# Patient Record
Sex: Female | Born: 1990
Health system: Southern US, Community
[De-identification: ages and names within clinical notes are randomized; demographics above are authoritative.]

---

## 2012-07-22 DIAGNOSIS — J45909 Unspecified asthma, uncomplicated: Secondary | ICD-10-CM | POA: Insufficient documentation

## 2015-08-09 ENCOUNTER — Ambulatory Visit (INDEPENDENT_AMBULATORY_CARE_PROVIDER_SITE_OTHER): Payer: BC Managed Care – PPO | Admitting: Family Medicine

## 2015-08-09 ENCOUNTER — Encounter: Payer: Self-pay | Admitting: Family Medicine

## 2015-08-09 ENCOUNTER — Ambulatory Visit (INDEPENDENT_AMBULATORY_CARE_PROVIDER_SITE_OTHER): Payer: BC Managed Care – PPO

## 2015-08-09 VITALS — BP 123/78 | HR 65 | Wt 135.0 lb

## 2015-08-09 DIAGNOSIS — S93491A Sprain of other ligament of right ankle, initial encounter: Secondary | ICD-10-CM

## 2015-08-09 DIAGNOSIS — Z8781 Personal history of (healed) traumatic fracture: Secondary | ICD-10-CM | POA: Insufficient documentation

## 2015-08-09 DIAGNOSIS — M79662 Pain in left lower leg: Secondary | ICD-10-CM

## 2015-08-09 DIAGNOSIS — S93431A Sprain of tibiofibular ligament of right ankle, initial encounter: Secondary | ICD-10-CM | POA: Diagnosis not present

## 2015-08-09 DIAGNOSIS — M25571 Pain in right ankle and joints of right foot: Secondary | ICD-10-CM

## 2015-08-09 DIAGNOSIS — M2170 Unequal limb length (acquired), unspecified site: Secondary | ICD-10-CM | POA: Diagnosis not present

## 2015-08-09 DIAGNOSIS — S93439A Sprain of tibiofibular ligament of unspecified ankle, initial encounter: Secondary | ICD-10-CM | POA: Insufficient documentation

## 2015-08-09 DIAGNOSIS — M898X6 Other specified disorders of bone, lower leg: Secondary | ICD-10-CM | POA: Insufficient documentation

## 2015-08-09 NOTE — Progress Notes (Signed)
Amy Gonzalez is a 25 y.o. female who [facility] today for right ankle pain and left tibia pain. Patient is an avid runner. Running is her therapy and she likes to training for marathons.  She notes right ankle sprain about 4 weeks ago. She suffered an inversion injury. She had x-rayed urgent care which did not show fracture. She's not really had any other treatment. She notes considerable ankle pain especially at the anterior lateral malleolus area. She notes difficulty with ankle dorsiflexion and plantarflexion. She notes pain with activity.  Additionally she notes left mid tibial pain. This is been ongoing about 6 weeks now. She denies any specific injury. The pain started with training for a marathon. She notes pain at the beginning of her runs and at the end of her runs. She notes pain following runs. She continues to have pain although she does not really run for about 4 weeks. She had an x-ray at orthopedics which was normal. She denies any weakness or numbness or tingling or loss of function.   No past medical history on file. No past surgical history on file. Social History  Substance Use Topics  . Smoking status: Never Smoker   . Smokeless tobacco: Not on file  . Alcohol Use: Not on file   family history is not on file.  ROS:  No headache, visual changes, nausea, vomiting, diarrhea, constipation, dizziness, abdominal pain, skin rash, fevers, chills, night sweats, weight loss, swollen lymph nodes, body aches, joint swelling, muscle aches, chest pain, shortness of breath, mood changes, visual or auditory hallucinations.    Medications: Current Outpatient Prescriptions  Medication Sig Dispense Refill  . JUNEL FE 1/20 1-20 MG-MCG tablet   1   No current facility-administered medications for this visit.   Allergies  Allergen Reactions  . Amoxicillin Hives  . Sulfamethoxazole-Trimethoprim Hives     Exam:  BP 123/78  mmHg  Pulse 65  Wt 135 lb (61.236 kg) General: Well Developed, well nourished, and in no acute distress.  Neuro/Psych: Alert and oriented x3, extra-ocular muscles intact, able to move all 4 extremities, sensation grossly intact. Skin: Warm and dry, no rashes noted.  Respiratory: Not using accessory muscles, speaking in full sentences, trachea midline.  Cardiovascular: Pulses palpable, no extremity edema. Abdomen: Does not appear distended. MSK:  Right ankle normal-appearing no swelling. Tender to palpation syndesmosis area. Motion limited plantar and dorsiflexion. Stable ligamentous exam. Positive talar tilt test with pain. Normal strength. Pulses capillary refill and sensation intact distally. Left tibia mildly tender over the medial portion of the mid tibial. Otherwise nontender. Ankle and knee are unremarkable. Hip abduction strength 4/5 bilaterally.  Leg length left leg is 88.56 cm right is 88.0 cm.   No results found for this or any previous visit (from the past 24 hour(s)). Dg Ankle Complete Right  08/09/2015  CLINICAL DATA:  Right ankle pain, rolled ankle 4 weeks ago. EXAM: RIGHT ANKLE - COMPLETE 3+ VIEW COMPARISON:  None. FINDINGS: There is no evidence of fracture, dislocation, or joint effusion. There is no evidence of arthropathy or other focal bone abnormality. Soft tissues are unremarkable. IMPRESSION: Negative. Electronically Signed   By: Charlett NoseKevin  Dover M.D.   On: 08/09/2015 [id number]    25 year old runner with 1) syndesmosis ankle sprain. X-ray normal. Plan to refer to physical therapy. Recheck in a few weeks. Encourage extensive home exercise program.  2) left tibia pain concerning for stress fracture. Discussed options. MRI would be the  next test of choice. Differential includes exertional compartment syndrome. Patient would like to try a trial of treatment first. Plan for long leg Aircast. Recheck in about 2 weeks. Will likely resume exercises pain-free.  3) leg length  difference likely the possible cause of stress fracture. Recommend one quarter inch heel lift in the right shoe. Consider custom orthotics

## 2015-08-09 NOTE — Patient Instructions (Signed)
Thank you for coming in today. Use the aircast while active.  Attend PT.  Return in 2-4 weeks.  Do not run yet.  Bring your running shoes to the next visit.

## 2015-08-10 NOTE — Progress Notes (Signed)
Quick Note:  Ankle xray is normal. ______

## 2015-08-12 ENCOUNTER — Ambulatory Visit: Payer: BC Managed Care – PPO | Admitting: Physical Therapy

## 2015-08-12 ENCOUNTER — Ambulatory Visit: Payer: BC Managed Care – PPO | Attending: Family Medicine | Admitting: Physical Therapy

## 2015-08-12 DIAGNOSIS — R600 Localized edema: Secondary | ICD-10-CM

## 2015-08-12 DIAGNOSIS — M25671 Stiffness of right ankle, not elsewhere classified: Secondary | ICD-10-CM | POA: Insufficient documentation

## 2015-08-12 DIAGNOSIS — M6281 Muscle weakness (generalized): Secondary | ICD-10-CM | POA: Diagnosis present

## 2015-08-12 NOTE — Therapy (Signed)
[facility] [address] [place], [place], [address] Phone: [phone]   Fax:  [phone]  Physical Therapy Evaluation  Patient Details  Name: Amy Gonzalez MRN: [id number] Date of Birth: September 01, 1990 Referring Provider: Rodolph Bong MD  Encounter Date: 08/12/2015      PT End of Session - 08/12/15 1719    Visit Number 1   Number of Visits 12   Date for PT Re-Evaluation 09/23/15   PT Start Time 1630   PT Stop Time 1717   PT Time Calculation (min) 47 min   Activity Tolerance Patient tolerated treatment well   Behavior During Therapy [facility] for tasks assessed/performed      No past medical history on file.  No past surgical history on file.  There were no vitals filed for this visit.  Visit Diagnosis:  Stiffness of right ankle, not elsewhere classified - Plan: PT plan of care cert/re-cert  Localized edema - Plan: PT plan of care cert/re-cert  Muscle weakness (generalized) - Plan: PT plan of care cert/re-cert      Subjective Assessment - 08/12/15 1624    Subjective pt is a 25 y.o with CC of R ankle pain 4 1/2 weeks ago. She rpeorted running 12 1/2 miles in and was trying to avoid getting hit by traffic and as she hit her heel on the side walk her foot hit the grass and rolled her ankle. since onset she reports it has gotten better becuase she is able to walk on it without an assistive device. pt currentl y wears an ASO brace for support, She has tried running but has pain after.    Limitations Standing;Walking;House hold activities   How long can you sit comfortably? 20-30 min   How long can you stand comfortably? unlimited   How long can you walk comfortably? unlimited   Diagnostic tests x-ray 08/09/2015    Patient Stated Goals want to be able to run without pain ASAP.   Currently in Pain? Yes   Pain Score 3    Pain Location Ankle   Pain Orientation Right   Pain Descriptors / Indicators Throbbing  tender   Pain Type  Chronic pain   Pain Onset More than a month ago   Pain Frequency Intermittent   Aggravating Factors  After running, pulling toes up, side to side motion, stairs   Pain Relieving Factors stop and rest, ice with elevation,             [facility] PT Assessment - 08/12/15 1617    Assessment   Medical Diagnosis R high ankle sprain   Referring Provider Rodolph Bong MD   Onset Date/Surgical Date --  4 weeks ago   Hand Dominance Right   Next MD Visit 08/30/2015   Prior Therapy no   Precautions   Precautions None   Restrictions   Weight Bearing Restrictions No   Balance Screen   Has the patient fallen in the past 6 months Yes   How many times? 1   Has the patient had a decrease in activity level because of a fear of falling?  No   Is the patient reluctant to leave their home because of a fear of falling?  No   Home Environment   Living Environment Private residence   Living Arrangements Spouse/significant other   Available Help at Discharge Available PRN/intermittently;Available 24 hours/day   Type of Home House   Home Access Level entry   Home Layout Two level  Alternate Level Stairs-Number of Steps 12   Alternate Level Stairs-Rails Right   Home Equipment --  ASO ankle brace,    Prior Function   Level of Independence Independent;Independent with basic ADLs   Vocation Full time employment  6th grade   Vocation Requirements prolonged standing, walking    Leisure running, swimming, biking, walking the dog   Cognition   Overall Cognitive Status Within Functional Limits for tasks assessed   Observation/Other Assessments   Lower Extremity Functional Scale  46/80   Circumferential Edema   Circumferential - Left  47.5cm   Figure 8 Edema   Figure 8 - Right  48 cm   Posture/Postural Control   Posture/Postural Control Postural limitations   Postural Limitations Rounded Shoulders;Forward head   ROM / Strength   AROM / PROM / Strength AROM;PROM;Strength   AROM   AROM Assessment Site  Ankle   Right/Left Ankle Right;Left   Right Ankle Dorsiflexion -10  ERP   Right Ankle Plantar Flexion 30  ERP   Right Ankle Inversion 6   Right Ankle Eversion 1   Left Ankle Dorsiflexion 10   Left Ankle Plantar Flexion 46   Left Ankle Inversion 22   Left Ankle Eversion 12   PROM   PROM Assessment Site Ankle   Right/Left Ankle Right   Right Ankle Dorsiflexion 12  boggy feeling during PROM   Right Ankle Plantar Flexion 42   Right Ankle Inversion 22   Right Ankle Eversion 12   Strength   Strength Assessment Site Ankle   Right/Left Ankle Right;Left   Right Ankle Dorsiflexion 4/5   Right Ankle Plantar Flexion 4/5   Right Ankle Inversion 3+/5   Right Ankle Eversion 3+/5   Left Ankle Dorsiflexion 5/5   Left Ankle Plantar Flexion 5/5   Left Ankle Inversion 5/5   Left Ankle Eversion 5/5   Palpation   Palpation comment tenderness located at the ATFL, CFL PTFL as well as the peroneal brevis and tertisu muscles.    Special Tests    Special Tests Ankle/Foot Special Tests   Ankle/Foot Special Tests  Anterior Drawer Test;Talar Tilt Test;Kleiger Test   Anterior Drawer Test   Findings Positive   Side  Right   Talar Tilt Test    Findings Postive   Side  Right   Kleiger Test   Findings Positive   Side Right                           PT Education - 08/12/15 1719    Education provided Yes   Education Details evaluation findings, POC, goals, HEP   Person(s) Educated Patient   Methods Explanation   Comprehension Verbalized understanding          PT Short Term Goals - 08/12/15 1734    PT SHORT TERM GOAL #1   Title pt will be I with initial HEP (09/02/2015)   Time 3   Period Weeks   Status New   PT SHORT TERM GOAL #2   Title pt be able to verbalize and demonstrate techniques to reduce R ankle pain and edema via RICE and HEP (09/02/2015)   Time 3   Period Weeks   Status New           PT Long Term Goals - 08/12/15 1735    PT LONG TERM GOAL #1    Title pt will be I with all HEP as of last visit (09/23/2015)  Time 6   Period Weeks   Status New   PT LONG TERM GOAL #2   Title pt will improve her R ankle DF and Eversion to >/= 6 with degrees with </=2/10 pain to assist with functional and efficient gait pattern (09/23/2015)   Time 6   Period Weeks   Status New   PT LONG TERM GOAL #3   Title pt will improve R ankle strength to >/= 4/5 with </= 1/10 pain to assist with endurance and running activities to assist with pts personal goal of getting back to running (09/23/2015)   Time 6   Period Weeks   Status New   PT LONG TERM GOAL #4   Title pt will be able to maintain R SLS for >/= 20 sec with mild postural sway to assist with increased proprioception to prevent ankle injuries (09/23/2015)   Time 6   Period Weeks   Status New   PT LONG TERM GOAL #5   Title pt will improve her LEFS score by >/= 10 points to demonstrate improvement in function at discharge (09/23/2015)   Time 6   Period Weeks   Status New               Plan - 08/12/15 1720    Clinical Impression Statement Anuoluwapo presents to [facility] as a low complexity evaluation with CC of R ankle pain following rolling her ankle about 4 weeks ago while running. AROM is limited in all planes with the most significant motions being Dorsiflexion and everision, PROM of the R ankle is [facility] with soreness at end ranges.  She demonstrates limited strength seondary to soreness and limited AROM. She currenlty wears an ASO brace for support. Palpation revealed tendernerness in all lateral ligaments of the anlke as well as the superior anterior tibiofibular ligaments as well as peroneal muscle soreness. She would benefit from physical therapy to improve ankle AROM, strength and stability and maximize her function by addressing the impairments listed.    Pt will benefit from skilled therapeutic intervention in order to improve on the following deficits Pain;Improper body mechanics;Postural  dysfunction;Decreased strength;Increased edema;Impaired flexibility;Decreased range of motion;Decreased endurance;Decreased activity tolerance;Decreased balance;Hypomobility   Rehab Potential Good   PT Frequency 2x / week   PT Duration 6 weeks   PT Treatment/Interventions ADLs/Self Care Home Management;Electrical Stimulation;Cryotherapy;Iontophoresis /ml Dexamethasone;Moist Heat;Therapeutic exercise;Therapeutic activities;Dry needling;Passive range of motion;Patient/family education;Ultrasound;Balance training;Vasopneumatic Device;Taping;Manual techniques;Gait training   PT Next Visit Plan assess/ review HEP, Ankle mobility / mobs, manual for soreness in lateral ankle., strengthening, balance training, modalities PRN   PT Home Exercise Plan towel scrunches, ankle ABC's, 4-way ankle, calf stretching   Consulted and Agree with Plan of Care Patient         Problem List Patient Active Problem List   Diagnosis Date Noted  . Pain, joint, ankle, right 08/09/2015  . Personal history of healed traumatic fracture 08/09/2015  . Pain in left tibia 08/09/2015  . High ankle sprain 08/09/2015  . 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 [place], [place], [address] Phone: [phone]   Fax:  [phone]  Name: Amy Gonzalez MRN: [id number] Date of Birth: 25-Jan-1991

## 2015-08-30 ENCOUNTER — Ambulatory Visit: Payer: BC Managed Care – PPO | Admitting: Family Medicine

## 2015-08-31 ENCOUNTER — Ambulatory Visit: Payer: BC Managed Care – PPO | Attending: Family Medicine | Admitting: Physical Therapy

## 2015-08-31 DIAGNOSIS — R600 Localized edema: Secondary | ICD-10-CM | POA: Diagnosis present

## 2015-08-31 DIAGNOSIS — M6281 Muscle weakness (generalized): Secondary | ICD-10-CM

## 2015-08-31 DIAGNOSIS — R2241 Localized swelling, mass and lump, right lower limb: Secondary | ICD-10-CM | POA: Diagnosis present

## 2015-08-31 DIAGNOSIS — M25671 Stiffness of right ankle, not elsewhere classified: Secondary | ICD-10-CM

## 2015-09-01 NOTE — Therapy (Signed)
[facility] [address] [place], [place], [address] Phone: [phone]   Fax:  [phone]  Physical Therapy Treatment  Patient Details  Name: Amy Gonzalez MRN: [id number] Date of Birth: September 03, 1990 Referring Provider: Rodolph Bong MD  Encounter Date: 08/31/2015      PT End of Session - 08/31/15 1748    Visit Number 2   Number of Visits 12   Date for PT Re-Evaluation 09/23/15   PT Start Time 1630   PT Stop Time 1650   PT Time Calculation (min) 20 min   Activity Tolerance Patient tolerated treatment well   Behavior During Therapy [facility] for tasks assessed/performed      No past medical history on file.  No past surgical history on file.  There were no vitals filed for this visit.      Subjective Assessment - 08/31/15 1736    Subjective Pt reports she has been at the beach and has not had much time to do her exercisres. She tried running and had pain. Her pain today is about a 2/10.    Limitations Standing;Walking;House hold activities   Pain Score 2    Pain Location --  R lateral ankle    Pain Orientation Right   Pain Descriptors / Indicators Throbbing   Pain Type Chronic pain   Pain Onset More than a month ago   Pain Frequency Intermittent                         [facility] Adult PT Treatment/Exercise - 08/31/15 0001    Ankle Exercises: Aerobic   Stationary Bike 5 min    Ankle Exercises: Standing   Other Standing Ankle Exercises cone drill 1' 2x10; rebounder 3way x20; lateral band wals 10 steps x2; dot drill 2x5; eccentric step down 4 inch step 2x10; step on to air-ex forward/ lateral 2x10;                 PT Education - 09/01/15 0748    Education provided Yes   Education Details updated HEP and educated the patient on the improtance of performing her HEP   Person(s) Educated Patient   Methods Explanation;Demonstration;Verbal cues   Comprehension Verbalized understanding          PT  Short Term Goals - 08/12/15 1734    PT SHORT TERM GOAL #1   Title pt will be I with initial HEP (09/02/2015)   Time 3   Period Weeks   Status New   PT SHORT TERM GOAL #2   Title pt be able to verbalize and demonstrate techniques to reduce R ankle pain and edema via RICE and HEP (09/02/2015)   Time 3   Period Weeks   Status New           PT Long Term Goals - 08/12/15 1735    PT LONG TERM GOAL #1   Title pt will be I with all HEP as of last visit (09/23/2015)   Time 6   Period Weeks   Status New   PT LONG TERM GOAL #2   Title pt will improve her R ankle DF and Eversion to >/= 6 with degrees with </=2/10 pain to assist with functional and efficient gait pattern (09/23/2015)   Time 6   Period Weeks   Status New   PT LONG TERM GOAL #3   Title pt will improve R ankle strength to >/= 4/5 with </= 1/10 pain to  assist with endurance and running activities to assist with pts personal goal of getting back to running (09/23/2015)   Time 6   Period Weeks   Status New   PT LONG TERM GOAL #4   Title pt will be able to maintain R SLS for >/= 20 sec with mild postural sway to assist with increased proprioception to prevent ankle injuries (09/23/2015)   Time 6   Period Weeks   Status New   PT LONG TERM GOAL #5   Title pt will improve her LEFS score by >/= 10 points to demonstrate improvement in function at discharge (09/23/2015)   Time 6   Period Weeks   Status New       There-ex: to improve ankle strength  Neuro re-ed: single leg stance activity to improve ankle control  Manual therapy: AP glides; manual gastroc stretch to improve Lleftkle ROM       Patient will benefit from skilled therapeutic intervention in order to improve the following deficits and impairments:     Visit Diagnosis: Stiffness of right ankle, not elsewhere classified - Plan: PT plan of care cert/re-cert  Muscle weakness (generalized) - Plan: PT plan of care cert/re-cert  Localized swelling, mass and lump,  right lower limb - Plan: PT plan of care cert/re-cert     Problem List Patient Active Problem List   Diagnosis Date Noted  . Pain, joint, ankle, right 08/09/2015  . Personal history of healed traumatic fracture 08/09/2015  . Pain in left tibia 08/09/2015  . High ankle sprain 08/09/2015  . 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

## 2015-09-02 ENCOUNTER — Ambulatory Visit: Payer: BC Managed Care – PPO | Admitting: Physical Therapy

## 2015-09-02 DIAGNOSIS — R2241 Localized swelling, mass and lump, right lower limb: Secondary | ICD-10-CM

## 2015-09-02 DIAGNOSIS — M25671 Stiffness of right ankle, not elsewhere classified: Secondary | ICD-10-CM | POA: Diagnosis not present

## 2015-09-02 DIAGNOSIS — M6281 Muscle weakness (generalized): Secondary | ICD-10-CM

## 2015-09-02 NOTE — Therapy (Signed)
[facility] [address] [place], [place], [address] Phone: [phone]   Fax:  [phone]  Physical Therapy Treatment  Patient Details  Name: Amy Gonzalez MRN: [id number] Date of Birth: 1991/01/31 Referring Provider: Rodolph BongEvan S Corey MD  Encounter Date: 09/02/2015      PT End of Session - 09/02/15 1706    Visit Number 3   Number of Visits 12   Date for PT Re-Evaluation 09/23/15   Activity Tolerance Patient tolerated treatment well   Behavior During Therapy [facility] for tasks assessed/performed      No past medical history on file.  No past surgical history on file.  There were no vitals filed for this visit.                                 PT Short Term Goals - 09/02/15 1712    PT SHORT TERM GOAL #1   Title pt will be I with initial HEP (09/02/2015)   Baseline Pt performing HEP at home   Status Achieved   PT SHORT TERM GOAL #2   Title Pt performing rice when needed    Time 3   Period Weeks   Status New           PT Long Term Goals - 09/02/15 1713    PT LONG TERM GOAL #1   Title pt will be I with all HEP as of last visit (09/23/2015)   Time 6   Period Weeks   Status On-going   PT LONG TERM GOAL #2   Title pt will improve her R ankle DF and Eversion to >/= 6 with degrees with </=2/10 pain to assist with functional and efficient gait pattern (09/23/2015)   Baseline DF 20 degrees today without pain.    Time 6   Period Weeks   Status Achieved   PT LONG TERM GOAL #3   Title pt will improve R ankle strength to >/= 4/5 with </= 1/10 pain to assist with endurance and running activities to assist with pts personal goal of getting back to running (09/23/2015)   Time 6   Period Weeks   Status On-going   PT LONG TERM GOAL #4   Title pt will be able to maintain R SLS for >/= 20 sec with mild postural sway to assist with increased proprioception to prevent ankle injuries (09/23/2015)   Baseline  performing high level dynamic single leg activity    Time 6   Period Weeks   Status On-going   PT LONG TERM GOAL #5   Title pt will improve her LEFS score by >/= 10 points to demonstrate improvement in function at discharge (09/23/2015)   Time 6   Period Weeks   Status New               Plan - 09/02/15 1707    Clinical Impression Statement Pt tolerated treatment well. She had no significant increase in pain with single leg stance activity. PT added in cybex leg press and resisted cable walk forward and back. She will attmept running with her running group at [employer] today.    PT Frequency 2x / week   PT Duration 6 weeks   PT Treatment/Interventions ADLs/Self Care Home Management;Electrical Stimulation;Cryotherapy;Iontophoresis 4mg /ml Dexamethasone;Moist Heat;Therapeutic exercise;Therapeutic activities;Dry needling;Passive range of motion;Patient/family education;Ultrasound;Balance training;Vasopneumatic Device;Taping;Manual techniques;Gait training   PT Next Visit Plan assess/ review HEP, Ankle mobility / mobs, manual  for soreness in lateral ankle., strengthening, balance training, modalities PRN   PT Home Exercise Plan towel scrunches, ankle ABC's, 4-way ankle, calf stretching, cone drill, lateral band walks, eccentric step downs, eccentric calf raise, dot drill.    Consulted and Agree with Plan of Care Patient     There-ex: to improve eccentric control and strength  Neuro re-ed: to improve single leg stance  Manual therapy: to improve df ROM   Patient will benefit from skilled therapeutic intervention in order to improve the following deficits and impairments:  Pain, Improper body mechanics, Postural dysfunction, Decreased strength, Increased edema, Impaired flexibility, Decreased range of motion, Decreased endurance, Decreased activity tolerance, Decreased balance, Hypomobility  Visit Diagnosis: Stiffness of right ankle, not elsewhere classified  Muscle weakness  (generalized)  Localized swelling, mass and lump, right lower limb     Problem List Patient Active Problem List   Diagnosis Date Noted  . Pain, joint, ankle, right 08/09/2015  . Personal history of healed traumatic fracture 08/09/2015  . Pain in left tibia 08/09/2015  . High ankle sprain 08/09/2015  . 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

## 2015-09-06 ENCOUNTER — Ambulatory Visit: Payer: BC Managed Care – PPO | Admitting: Physical Therapy

## 2015-09-06 DIAGNOSIS — M25671 Stiffness of right ankle, not elsewhere classified: Secondary | ICD-10-CM | POA: Diagnosis not present

## 2015-09-06 DIAGNOSIS — R2241 Localized swelling, mass and lump, right lower limb: Secondary | ICD-10-CM

## 2015-09-06 DIAGNOSIS — R600 Localized edema: Secondary | ICD-10-CM

## 2015-09-06 DIAGNOSIS — M6281 Muscle weakness (generalized): Secondary | ICD-10-CM

## 2015-09-06 NOTE — Therapy (Signed)
[facility] [address] [place], [place], [address] Phone: [phone]   Fax:  [phone]  Physical Therapy Treatment  Patient Details  Name: Amy PesaJulia Gonzalez MRN: [id number] Date of Birth: 1990-10-25 Referring Provider: Rodolph BongEvan S Corey MD  Encounter Date: 09/06/2015      PT End of Session - 09/06/15 1717    Visit Number 45   Number of Visits 12   Date for PT Re-Evaluation 09/23/15   PT Start Time 1630   PT Stop Time 1715   PT Time Calculation (min) 45 min      No past medical history on file.  No past surgical history on file.  There were no vitals filed for this visit.      Subjective Assessment - 09/06/15 1639    Subjective Pt ran 3 miles without pain. She feels like it is improving. The only thing she is having some pain with is eccentric heel raises.    Limitations Standing;Walking;House hold activities   How long can you sit comfortably? 20-30 min   How long can you stand comfortably? unlimited   How long can you walk comfortably? unlimited   Diagnostic tests x-ray 08/09/2015    Patient Stated Goals want to be able to run without pain ASAP.   Currently in Pain? No/denies                         [facility] Treatment/Exercise - 09/06/15 0001    Ankle Exercises: Aerobic   Elliptical 5 min    Ankle Exercises: Standing   Other Standing Ankle Exercises cone drill 1' 2x10; rebounder 3way x20; lateral band wals 10 steps x2; dot drill 2x5; eccentric step down 4 inch step 2x10; step on to air-ex forward/ lateral 2x10; Bosu single leg hops 2x10 fwd and lateral; bosu squats 2x10;                 PT Education - 09/06/15 1642    Education Details continue slow progression of running activity.    Person(s) Educated Patient   Methods Explanation   Comprehension Verbalized understanding          PT Short Term Goals - 09/06/15 1719    PT SHORT TERM GOAL #1   Title pt will be I with initial HEP  (09/02/2015)   Baseline Pt performing HEP at home   Time 3   Period Weeks   PT SHORT TERM GOAL #2   Title Pt performing rice when needed    Baseline Pt perfroming as needed.    Status On-going           PT Long Term Goals - 09/06/15 1720    PT LONG TERM GOAL #1   Title pt will be I with all HEP as of last visit (09/23/2015)   Time 6   Period Weeks   Status On-going   PT LONG TERM GOAL #2   Title pt will improve her R ankle DF and Eversion to >/= 6 with degrees with </=2/10 pain to assist with functional and efficient gait pattern (09/23/2015)   PT LONG TERM GOAL #3   Title pt will improve R ankle strength to >/= 4/5 with </= 1/10 pain to assist with endurance and running activities to assist with pts personal goal of getting back to running (09/23/2015)   Time 6   Period Weeks   PT LONG TERM GOAL #4   Title pt will be able  to maintain R SLS for >/= 20 sec with mild postural sway to assist with increased proprioception to prevent ankle injuries (09/23/2015)   Baseline performing high level dynamic single leg activity    Time 6   Period Weeks   Status Achieved   PT LONG TERM GOAL #5   Title pt will improve her LEFS score by >/= 10 points to demonstrate improvement in function at discharge (09/23/2015)   Baseline to be assessed   Time 6   Period Weeks   Status New               Plan - 09/06/15 1718    Clinical Impression Statement Pt continues to make great progress. She had no pain today and had no pain running. She is approaching all long term goals. She is concerned about some lingering pain. Therapy assured her if she keeps up with her single leg stance activity her pain should decrease.   Rehab Potential Good   PT Frequency 2x / week   PT Duration 6 weeks   PT Treatment/Interventions ADLs/Self Care Home Management;Electrical Stimulation;Cryotherapy;Iontophoresis /ml Dexamethasone;Moist Heat;Therapeutic exercise;Therapeutic activities;Dry needling;Passive range of  motion;Patient/family education;Ultrasound;Balance training;Vasopneumatic Device;Taping;Manual techniques;Gait training   PT Next Visit Plan assess/ review HEP, Ankle mobility / mobs, manual for soreness in lateral ankle., strengthening, balance training, modalities PRN   PT Home Exercise Plan towel scrunches, ankle ABC's, 4-way ankle, calf stretching, cone drill, lateral band walks, eccentric step downs, eccentric calf raise, dot drill.    Consulted and Agree with Plan of Care Patient      Patient will benefit from skilled therapeutic intervention in order to improve the following deficits and impairments:  Pain, Improper body mechanics, Postural dysfunction, Decreased strength, Increased edema, Impaired flexibility, Decreased range of motion, Decreased endurance, Decreased activity tolerance, Decreased balance, Hypomobility  Visit Diagnosis: Muscle weakness (generalized)  Localized edema  Stiffness of right ankle, not elsewhere classified  Localized swelling, mass and lump, right lower limb  There-ex: to improve ankle strength and stability  Neuro-muscualr control: Pt educated on the importance of single leg stability. Pt performed activity to increase neuro muscular control with running.    Problem List Patient Active Problem List   Diagnosis Date Noted  . Pain, joint, ankle, right 08/09/2015  . Personal history of healed traumatic fracture 08/09/2015  . Pain in left tibia 08/09/2015  . High ankle sprain 08/09/2015  . 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

## 2015-09-08 ENCOUNTER — Ambulatory Visit: Payer: BC Managed Care – PPO | Admitting: Physical Therapy

## 2015-09-08 DIAGNOSIS — M6281 Muscle weakness (generalized): Secondary | ICD-10-CM

## 2015-09-08 DIAGNOSIS — R600 Localized edema: Secondary | ICD-10-CM

## 2015-09-08 DIAGNOSIS — M25671 Stiffness of right ankle, not elsewhere classified: Secondary | ICD-10-CM

## 2015-09-08 NOTE — Therapy (Addendum)
Amy Gonzalez, [place], [address] Phone: [phone]   Fax:  [phone]  Physical Therapy Treatment  Patient Details  Name: Amy Gonzalez MRN: [id number] Date of Birth: 02/24/1991 Referring Provider: Gregor Hams MD  Encounter Date: 09/08/2015      PT End of Session - 09/08/15 2016    Visit Number 6   Number of Visits 12   Date for PT Re-Evaluation 09/23/15   PT Start Time 1630   PT Stop Time 1710   PT Time Calculation (min) 40 min   Activity Tolerance Patient limited by pain   Behavior During Therapy [facility] for tasks assessed/performed      No past medical history on file.  No past surgical history on file.  There were no vitals filed for this visit.      Subjective Assessment - 09/08/15 2014    Subjective Pt ran 2 more miles on Wednesday. She was sore from the running and the treatment. She is doing her exercises. She had some snterior ankle pain this morning.    Limitations Standing;Walking;House hold activities   How long can you sit comfortably? 20-30 min   How long can you stand comfortably? unlimited   How long can you walk comfortably? unlimited   Diagnostic tests x-ray 08/09/2015    Patient Stated Goals want to be able to run without pain ASAP.   Pain Score 2                          [facility] Adult PT Treatment/Exercise - 09/08/15 0001    Ankle Exercises: Aerobic   Elliptical 5 min    Ankle Exercises: Standing   Other Standing Ankle Exercises cone drill 1' 2x10; rebounder 3way x20; lateral band wals 10 steps x2; dot drill 2x5; eccentric step down 4 inch step 2x10; step on to air-ex forward/ lateral 2x10; Bosu single leg hops 2x10 fwd and lateral; bosu squats 2x10;    Other Standing Ankle Exercises lunges 2x10                 PT Education - 09/08/15 2015    Education provided Yes   Person(s) Educated Patient   Methods Explanation   Comprehension Verbalized  understanding          PT Short Term Goals - 09/06/15 1719    PT SHORT TERM GOAL #1   Title pt will be I with initial HEP (09/02/2015)   Baseline Pt performing HEP at home   Time 3   Period Weeks   PT SHORT TERM GOAL #2   Title Pt performing rice when needed    Baseline Pt perfroming as needed.    Status On-going           PT Long Term Goals - 09/06/15 1720    PT LONG TERM GOAL #1   Title pt will be I with all HEP as of last visit (09/23/2015)   Time 6   Period Weeks   Status On-going   PT LONG TERM GOAL #2   Title pt will improve her R ankle DF and Eversion to >/= 6 with degrees with </=2/10 pain to assist with functional and efficient gait pattern (09/23/2015)   PT LONG TERM GOAL #3   Title pt will improve R ankle strength to >/= 4/5 with </= 1/10 pain to assist with endurance and running activities to assist with pts personal goal of getting back to  running (09/23/2015)   Time 6   Period Weeks   PT LONG TERM GOAL #4   Title pt will be able to maintain R SLS for >/= 20 sec with mild postural sway to assist with increased proprioception to prevent ankle injuries (09/23/2015)   Baseline performing high level dynamic single leg activity    Time 6   Period Weeks   Status Achieved   PT LONG TERM GOAL #5   Title pt will improve her LEFS score by >/= 10 points to demonstrate improvement in function at discharge (09/23/2015)   Baseline to be assessed   Time 6   Period Weeks   Status New               Plan - 09/08/15 2020    Clinical Impression Statement Pt is making good progress depsite some pain after the last visit. She is having only minor pain. She will continue to up her milage and continue with exercises. She wil return to physical therapy PRN. If she feels like she is not maintaining her progress she may follow up. If not D/C in 3-4 weeks.    Rehab Potential Good   PT Frequency 2x / week   PT Duration 6 weeks   PT Treatment/Interventions ADLs/Self Care Home  Management;Electrical Stimulation;Cryotherapy;Iontophoresis 36m/ml Dexamethasone;Moist Heat;Therapeutic exercise;Therapeutic activities;Dry needling;Passive range of motion;Patient/family education;Ultrasound;Balance training;Vasopneumatic Device;Taping;Manual techniques;Gait training   PT Next Visit Plan assess/ review HEP, Ankle mobility / mobs, manual for soreness in lateral ankle., strengthening, balance training, modalities PRN   PT Home Exercise Plan towel scrunches, ankle ABC's, 4-way ankle, calf stretching, cone drill, lateral band walks, eccentric step downs, eccentric calf raise, dot drill.    Consulted and Agree with Plan of Care Patient      Patient will benefit from skilled therapeutic intervention in order to improve the following deficits and impairments:  Pain, Improper body mechanics, Postural dysfunction, Decreased strength, Increased edema, Impaired flexibility, Decreased range of motion, Decreased endurance, Decreased activity tolerance, Decreased balance, Hypomobility  Visit Diagnosis: Localized edema  Muscle weakness (generalized)  Stiffness of right ankle, not elsewhere classified  PHYSICAL THERAPY DISCHARGE SUMMARY  Visits from Start of Care: 6  Current functional level related to goals / functional outcomes: Was having minimal symptoms. Did not follow up. D/C to HEP    Remaining deficits: Minimal pain at time of D/C   Education / Equipment: HEP  Plan: Patient agrees to discharge.  Patient goals were partially met. Patient is being discharged due to not returning since the last visit.  ?????       Problem List Patient Active Problem List   Diagnosis Date Noted  . Pain, joint, ankle, right 08/09/2015  . Personal history of healed traumatic fracture 08/09/2015  . Pain in left tibia 08/09/2015  . High ankle sprain 08/09/2015  . 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

## 2015-09-13 ENCOUNTER — Encounter: Payer: BC Managed Care – PPO | Admitting: Physical Therapy

## 2015-09-15 ENCOUNTER — Encounter: Payer: BC Managed Care – PPO | Admitting: Physical Therapy

## 2015-09-20 ENCOUNTER — Encounter: Payer: BC Managed Care – PPO | Admitting: Physical Therapy

## 2015-09-22 ENCOUNTER — Encounter: Payer: BC Managed Care – PPO | Admitting: Physical Therapy

## 2016-12-28 ENCOUNTER — Other Ambulatory Visit (HOSPITAL_COMMUNITY)
Admission: RE | Admit: 2016-12-28 | Discharge: 2016-12-28 | Disposition: A | Discharge status: Home / Self Care | Payer: BC Managed Care – PPO | Source: Ambulatory Visit | Attending: Family Medicine | Admitting: Family Medicine

## 2016-12-28 ENCOUNTER — Other Ambulatory Visit: Payer: Self-pay | Admitting: Family Medicine

## 2016-12-28 DIAGNOSIS — Z01411 Encounter for gynecological examination (general) (routine) with abnormal findings: Secondary | ICD-10-CM | POA: Insufficient documentation

## 2016-12-29 LAB — CYTOLOGY - PAP: Diagnosis: NEGATIVE

## 2017-11-22 IMAGING — CR DG ANKLE COMPLETE 3+V*R*
3 series · 3 of 3 positions shown · non-contrast
Comparison: None.

CLINICAL DATA: Right ankle pain, rolled ankle 4 weeks ago.

EXAM:
RIGHT ANKLE - COMPLETE 3+ VIEW

[ankle ap]
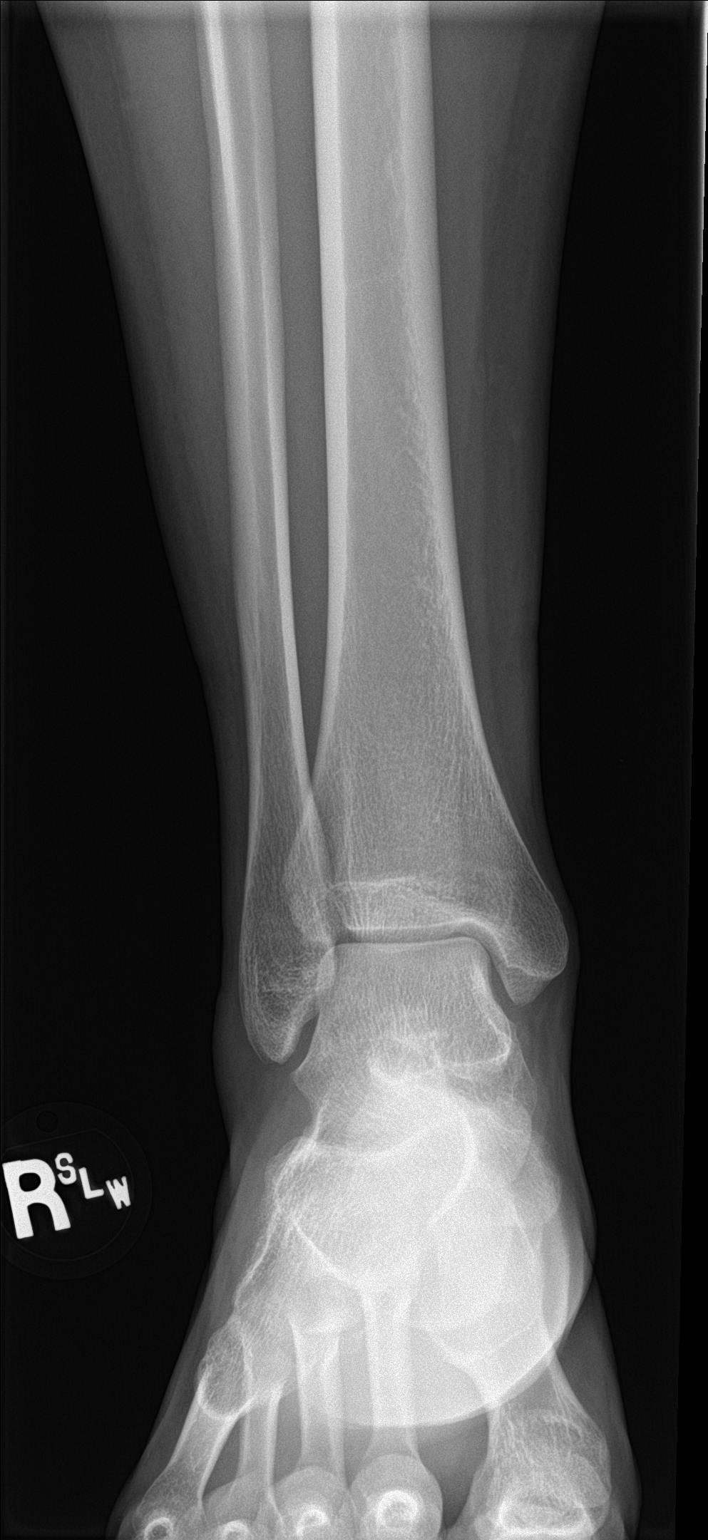

[ankle obl]
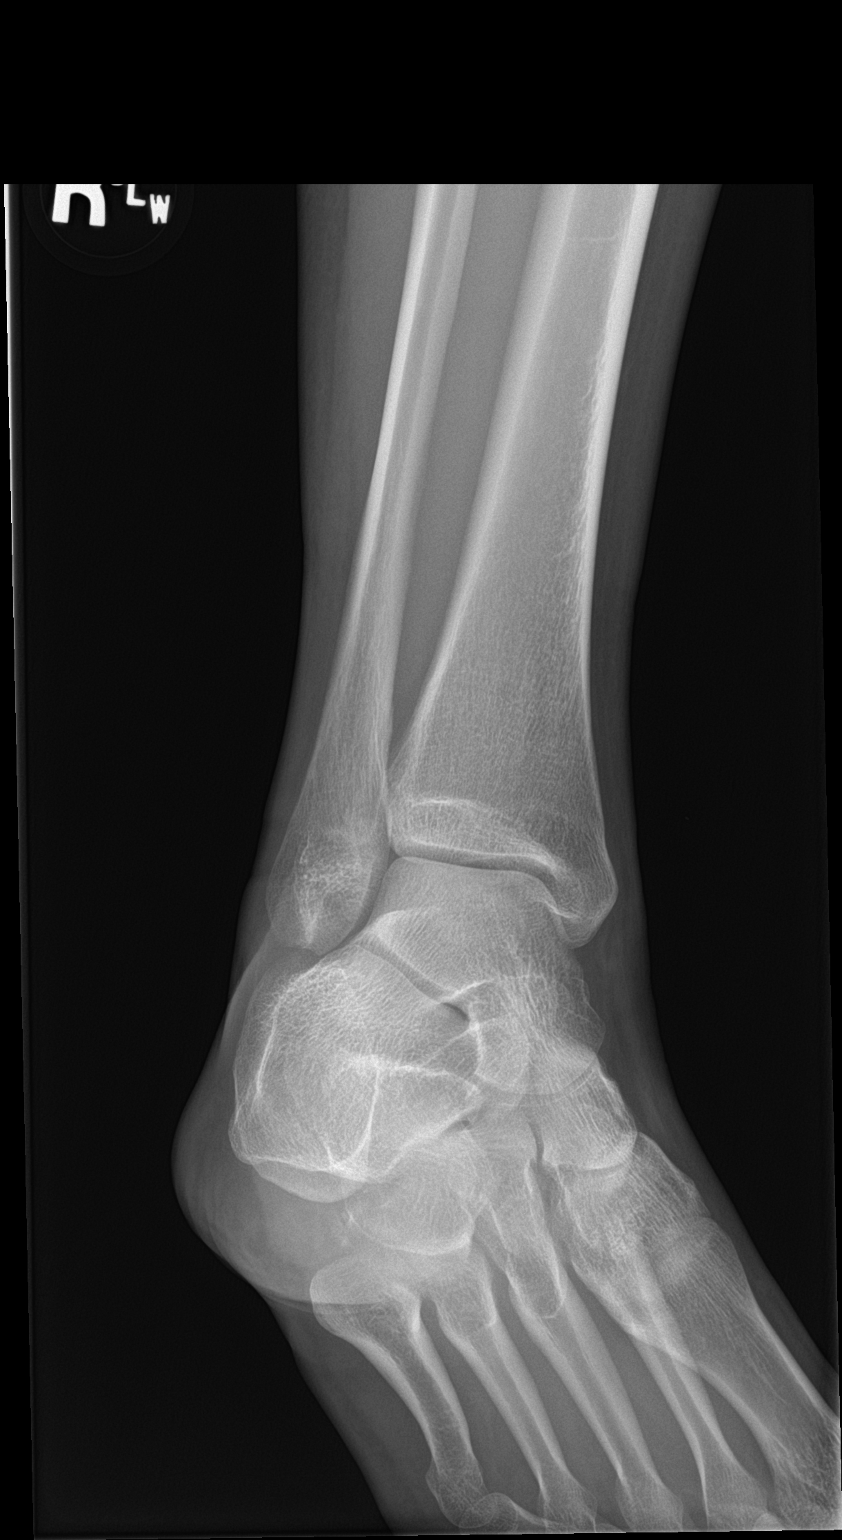

[ankle lat]
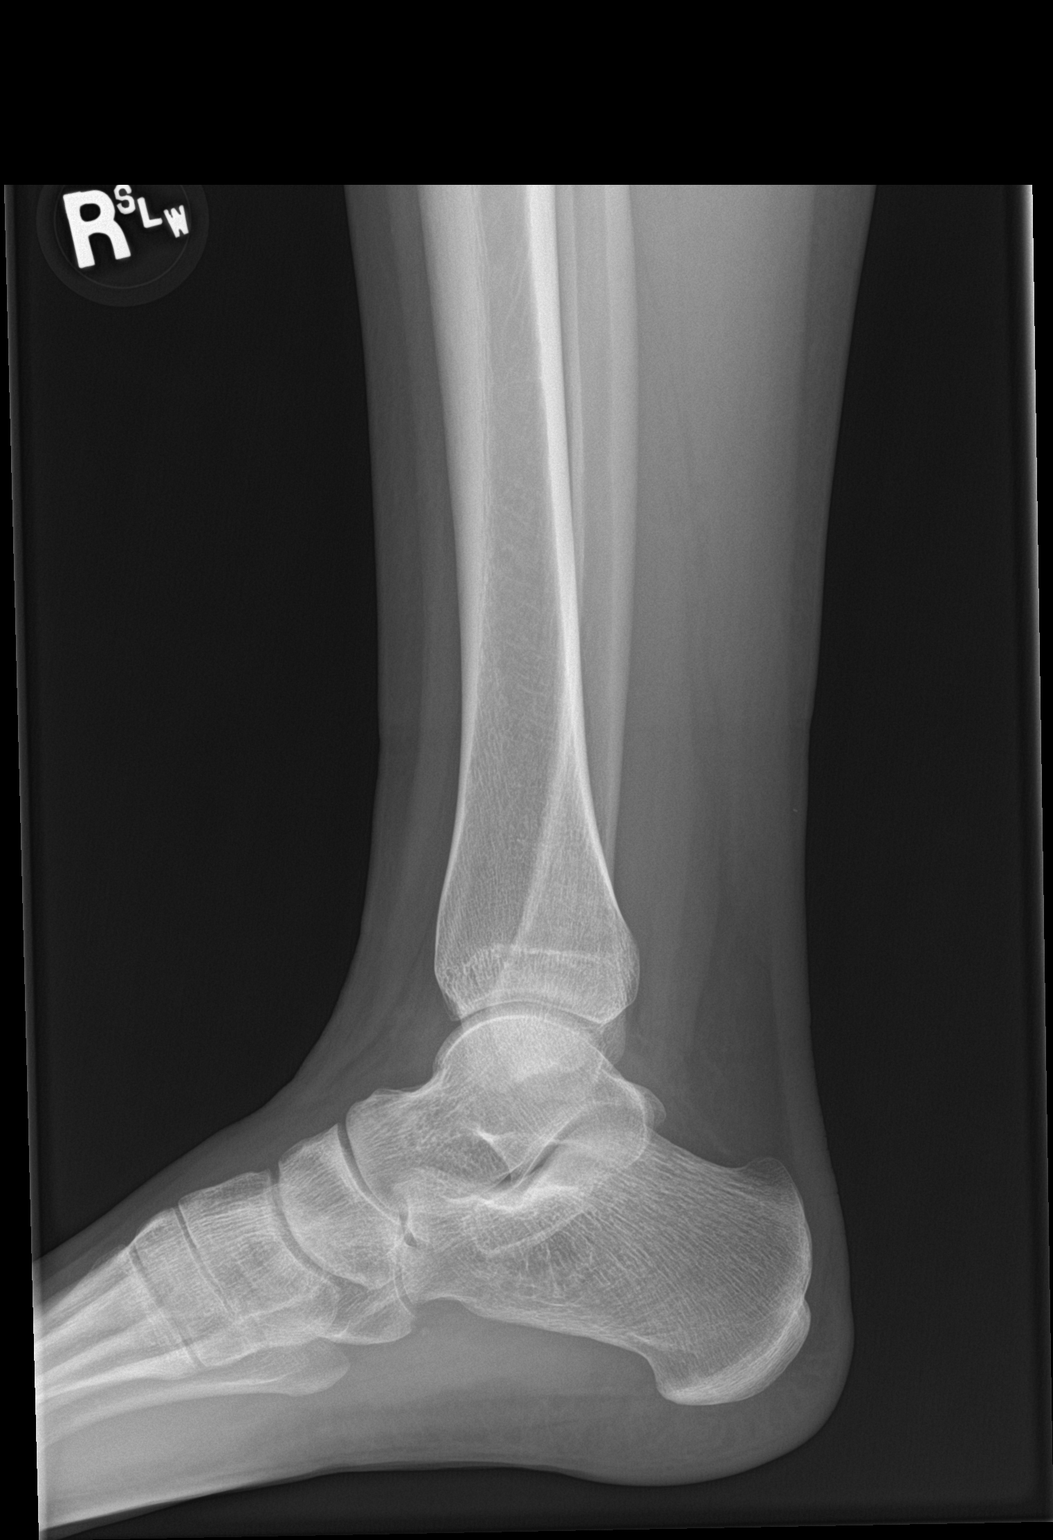

[3 of 3 positions shown; findings below may reference images not displayed]

FINDINGS: There is no evidence of fracture, dislocation, or joint effusion.
There is no evidence of arthropathy or other focal bone abnormality.
Soft tissues are unremarkable.
IMPRESSION: Negative.
